# Patient Record
Sex: Male | Born: 2000
Health system: Southern US, Community
[De-identification: ages and names within clinical notes are randomized; demographics above are authoritative.]

## PROBLEM LIST (undated history)

## (undated) DIAGNOSIS — R569 Unspecified convulsions: Secondary | ICD-10-CM

## (undated) HISTORY — PX: NO PAST SURGERIES: SHX2092

## (undated) HISTORY — DX: Unspecified convulsions: R56.9

---
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
## 2021-01-01 DIAGNOSIS — S42412D Displaced simple supracondylar fracture without intercondylar fracture of left humerus, subsequent encounter for fracture with routine healing: Secondary | ICD-10-CM | POA: Diagnosis not present

## 2021-01-05 DIAGNOSIS — R531 Weakness: Secondary | ICD-10-CM | POA: Diagnosis not present

## 2021-01-05 DIAGNOSIS — S42492D Other displaced fracture of lower end of left humerus, subsequent encounter for fracture with routine healing: Secondary | ICD-10-CM | POA: Diagnosis not present

## 2021-01-05 DIAGNOSIS — M25622 Stiffness of left elbow, not elsewhere classified: Secondary | ICD-10-CM | POA: Diagnosis not present

## 2021-01-08 DIAGNOSIS — M25622 Stiffness of left elbow, not elsewhere classified: Secondary | ICD-10-CM | POA: Diagnosis not present

## 2021-01-08 DIAGNOSIS — S42492D Other displaced fracture of lower end of left humerus, subsequent encounter for fracture with routine healing: Secondary | ICD-10-CM | POA: Diagnosis not present

## 2021-01-08 DIAGNOSIS — R531 Weakness: Secondary | ICD-10-CM | POA: Diagnosis not present

## 2021-01-12 DIAGNOSIS — M25622 Stiffness of left elbow, not elsewhere classified: Secondary | ICD-10-CM | POA: Diagnosis not present

## 2021-01-12 DIAGNOSIS — R531 Weakness: Secondary | ICD-10-CM | POA: Diagnosis not present

## 2021-01-12 DIAGNOSIS — S42492D Other displaced fracture of lower end of left humerus, subsequent encounter for fracture with routine healing: Secondary | ICD-10-CM | POA: Diagnosis not present

## 2021-01-22 DIAGNOSIS — M25622 Stiffness of left elbow, not elsewhere classified: Secondary | ICD-10-CM | POA: Diagnosis not present

## 2021-01-22 DIAGNOSIS — S42492D Other displaced fracture of lower end of left humerus, subsequent encounter for fracture with routine healing: Secondary | ICD-10-CM | POA: Diagnosis not present

## 2021-01-22 DIAGNOSIS — R531 Weakness: Secondary | ICD-10-CM | POA: Diagnosis not present

## 2021-01-26 DIAGNOSIS — R531 Weakness: Secondary | ICD-10-CM | POA: Diagnosis not present

## 2021-01-26 DIAGNOSIS — M25662 Stiffness of left knee, not elsewhere classified: Secondary | ICD-10-CM | POA: Diagnosis not present

## 2021-01-26 DIAGNOSIS — S42492D Other displaced fracture of lower end of left humerus, subsequent encounter for fracture with routine healing: Secondary | ICD-10-CM | POA: Diagnosis not present

## 2021-02-05 DIAGNOSIS — S42492D Other displaced fracture of lower end of left humerus, subsequent encounter for fracture with routine healing: Secondary | ICD-10-CM | POA: Diagnosis not present

## 2021-02-05 DIAGNOSIS — R531 Weakness: Secondary | ICD-10-CM | POA: Diagnosis not present

## 2021-02-05 DIAGNOSIS — M25622 Stiffness of left elbow, not elsewhere classified: Secondary | ICD-10-CM | POA: Diagnosis not present

## 2021-02-16 ENCOUNTER — Telehealth: Payer: Self-pay

## 2021-02-16 ENCOUNTER — Telehealth: Payer: Self-pay | Admitting: Neurology

## 2021-02-16 NOTE — Telephone Encounter (Signed)
Pt c/o: seizure Missed medications?  no  Sleep deprived?  no Alcohol intake?  none Back to their usual baseline self?  Yes . If no, advise go to ER Current medications prescribed by Dr. Aquino:epidolex 100/ml and zonegran, note patient is feeling fine, just wanted to report a seziure acitivity

## 2021-02-16 NOTE — Telephone Encounter (Signed)
Noted, thanks!

## 2021-02-16 NOTE — Telephone Encounter (Signed)
Pt is calling to inform aquino that he had a seizure. He is okay, it lasted the most 2 minutes.

## 2021-02-19 DIAGNOSIS — S42492D Other displaced fracture of lower end of left humerus, subsequent encounter for fracture with routine healing: Secondary | ICD-10-CM | POA: Diagnosis not present

## 2021-03-09 DIAGNOSIS — S42492D Other displaced fracture of lower end of left humerus, subsequent encounter for fracture with routine healing: Secondary | ICD-10-CM | POA: Diagnosis not present

## 2021-04-20 DIAGNOSIS — S42492D Other displaced fracture of lower end of left humerus, subsequent encounter for fracture with routine healing: Secondary | ICD-10-CM | POA: Diagnosis not present

## 2021-05-11 ENCOUNTER — Other Ambulatory Visit: Payer: Self-pay

## 2021-05-11 ENCOUNTER — Encounter: Payer: Self-pay | Admitting: Neurology

## 2021-05-11 ENCOUNTER — Telehealth (INDEPENDENT_AMBULATORY_CARE_PROVIDER_SITE_OTHER): Payer: BC Managed Care – PPO | Admitting: Neurology

## 2021-05-11 VITALS — Ht 72.0 in | Wt 210.0 lb

## 2021-05-11 DIAGNOSIS — G40219 Localization-related (focal) (partial) symptomatic epilepsy and epileptic syndromes with complex partial seizures, intractable, without status epilepticus: Secondary | ICD-10-CM

## 2021-05-11 MED ORDER — EPIDIOLEX 100 MG/ML PO SOLN: ORAL | 3 refills | Status: DC

## 2021-05-11 MED ORDER — ZONISAMIDE 100 MG PO CAPS: ORAL_CAPSULE | ORAL | 3 refills | Status: DC

## 2021-05-11 NOTE — Addendum Note (Signed)
Addended by: Dimas Chyle on: 05/11/2021 11:54 AM   Modules accepted: Orders

## 2021-05-11 NOTE — Progress Notes (Signed)
Virtual Visit via Video Note The purpose of this virtual visit is to provide medical care while limiting exposure to the novel coronavirus.    Consent was obtained for video visit:  Yes.   Answered questions that patient had about telehealth interaction:  Yes.   I discussed the limitations, risks, security and privacy concerns of performing an evaluation and management service by telemedicine. I also discussed with the patient that there may be a patient responsible charge related to this service. The patient expressed understanding and agreed to proceed.  Pt location: Home Physician Location: office Name of referring provider:  No ref. provider found I connected with Christian Harvey at patients initiation/request on 05/11/2021 at 11:30 AM EST by video enabled telemedicine application and verified that I am speaking with the correct person using two identifiers. Pt MRN:  [id number] Pt DOB:  12-17-2000 Video Participants:  Christian Harvey   History of Present Illness:  The patient had a virtual video visit on 05/11/2021. He was last seen in the neurology clinic 5 months ago for intractable epilepsy. On his last visit, he reported diarrhea on Epidiolex, as well as elevated transaminases. Epidiolex dose reduced to 35mL in AM, 7.38mL in PM. He however reported a seizure on 09/22/20 after being seizure-free for a year, so we agreed to add on low dose Zonisamide 100mg  qhs. He called our office to report a convulsive seizure lasting 2 minutes on 02/16/21, no clear triggers. He denies any staring/confusional episodes. No side effects to current regimen. The diarrhea has significantly improved, he is a little bit now but not that much. He denies any headaches, dizziness, vision changes, focal numbness/tingling/weakness. Sometimes his legs or arms feel weak when he wakes up and did not get enough sleep. Overall he sleeps pretty good with 8 hours at night. He denies any stomach pain. No falls. He will be  [place]rav[place] a [place].   Last AST/ALT in 10/2020 was 153/223.   History on Initial Assessment 06/08/2020: This is a pleasant 20 year old right-handed man with a history of migraines and seizures presenting to establish adult epilepsy care. He was last seen by pediatric neurologist Dr. 15 in 03/2020 and in the past by neurologist Dr. 04/2020 at [facility], records were reviewed and will be summarized as follows. Christian Harvey started having seizures in 2014, the first seizure occurred in a baseball game, he recalls feeling confused, no convulsive activity. Notes from 2014 indicate he did not understand that he needed to pick up the bat to bat and could not answer family information. EEG reported bilateral spike and wave. He was initially started on Depakote which caused weight gain. He had been seizure-free for a year and his mother discontinued use of the medication. He was stable off medication for 6-8 months then had a grand mal seizure on the baseball field, restarted on Depakote which caused drowsiness, then switched to Keppra. He was stable for 2 years until 2018 when he had 3 seizures on the baseball field in a span of 1-2 weeks. He reported getting a headache, photo and phonophobia before the seizures. His baseball coach described episodes where he shakes his head, tells coach he doesn't feel right, then would be confused when calling his mother. He then had another GTC in sleep while at his grandparents house. Depakote was added to Keppra, which again caused side effects. Trileptal was started in 2019 which caused him to feel "high as a kite." EEG in 07/2017 showed "  frequent, sometimes rhythmic vertex sharp waves as well as right temporo-occipital discharges. Left sided discharges appeared improved prior to EEG, but this recording continues to be consistent with focal epilepsy." He was started on Epidiolex in 2019. He had been seizure-free for 2 years until last seizure on 09/01/19 witnessed by his  grandfather. Dr. Blair Heys notes indicate that Epidolex was increased to 4.56mL in AM, 20mL in PM with no further seizures. His grandfather reports that since the seizure they have been giving him 14mL in AM, 7.6mL in PM. He denies any side effects, he was previously having nausea/vomiting and was taking Zofran, but has not needed this in a long time. He has not needed the Valtoco. He reports frequent soft stools for close to a year, no abdominal pain or weight loss. He denies any olfactory/gustatory hallucinations, deja vu, rising epigastric sensation, focal numbness/tingling/weakness. He has occasionally "quick flinches" which his grandfather also has noticed early in the morning. He has migraines every 2-3 months with good response to over the counter pain medication. No associated nausea/vomiting. He denies any dizziness, diplopia, dysarthria/dysphagia, neck/back pain. He gets from 6 to 9 hours of sleep. 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 He has been driving.   Epilepsy Risk Factors:  Maternal aunt has seizures. Otherwise he had a normal birth and early development.  There is no history of febrile convulsions, CNS infections such as meningitis/encephalitis, significant traumatic brain injury, neurosurgical procedures.  Prior AEDs: Trileptal (sedation), Depakote (weight gain), Keppra (weaned to Epidiolex monotherapy)  Diagnostic Data: EEGs: EEG in 07/2017 showed "frequent, sometimes rhythmic vertex sharp waves as well as right temporo-occipital discharges. Left sided discharges appeared improved prior to EEG, but this recording continues to be consistent with focal epilepsy."  MRI: MRI brain without contrast done at [facility] in 2018 reported as normal.   Current Outpatient Medications on File Prior to Visit  Medication Sig Dispense Refill   EPIDIOLEX 100 MG/ML solution Take 5 mL in AM, 7.5 mL in PM 1125 mL 3   methocarbamol (ROBAXIN) 500 MG tablet  Take 1 tablet (500 mg total) by mouth every 6 (six) hours as needed for muscle spasms. 30 tablet 0   zonisamide (ZONEGRAN) 100 MG capsule Take 1 capsule every night 30 capsule 6   No current facility-administered medications on file prior to visit.     Observations/Objective:   Vitals:   05/11/21 1041  Weight: 210 lb (95.3 kg)  Height: 6' (1.829 m)   GEN:  The patient appears stated age and is in NAD.  Neurological examination: Patient is awake, alert. No aphasia or dysarthria. Intact fluency and comprehension. Cranial nerves: Extraocular movements intact with no nystagmus. No facial asymmetry. Motor: moves all extremities symmetrically, at least anti-gravity x 4.   Assessment and Plan:   This is a pleasant 20 yo RH man with a history of migraines and seizures suggestive of focal to bilateral tonic-clonic epilepsy. EEG in 07/2017 showed "frequent, sometimes rhythmic vertex sharp waves as well as right temporo-occipital discharges. Left sided discharges appeared improved prior to EEG, but this recording continues to be consistent with focal epilepsy." MRI brain without contrast done at [facility] in 2018 reported as normal. He reports one convulsion in the past 7 months (02/2021). He is doing better with diarrhea on the lower dose of Epidiolex 75mL in AM, 7.9mL in PM, Zonisamide 100mg  qhs. Continue to monitor LFTs. He is aware of [place] driving laws to  stop driving after a seizure until 6 months seizure-free. Follow-up in 6 months, call for any changes.    Follow Up Instructions:    -I discussed the assessment and treatment plan with the patient. The patient was provided an opportunity to ask questions and all were answered. The patient agreed with the plan and demonstrated an understanding of the instructions.   The patient was advised to call back or seek an in-person evaluation if the symptoms worsen or if the condition fails to improve as anticipated.    Van Clines, MD

## 2021-05-11 NOTE — Patient Instructions (Signed)
Good to hear you are doing well. Continue current medications. Please have your liver function test done. Follow-up in 6 months, call for any changes.    Seizure Precautions: 1. If medication has been prescribed for you to prevent seizures, take it exactly as directed.  Do not stop taking the medicine without talking to your doctor first, even if you have not had a seizure in a long time.   2. Avoid activities in which a seizure would cause danger to yourself or to others.  Don't operate dangerous machinery, swim alone, or climb in high or dangerous places, such as on ladders, roofs, or girders.  Do not drive unless your doctor says you may.  3. If you have any warning that you may have a seizure, lay down in a safe place where you can't hurt yourself.    4.  No driving for 6 months from last seizure, as per [facility].   Please refer to the following link on the Epile[employer] of [employer]'s website for more information: http://www.epilepsyfoundation.org/answerplace/Social/driving/drivingu.cfm   5.  Maintain good sleep hygiene. Avoid alcohol.  6.  Contact your doctor if you have any problems that may be related to the medicine you are taking.  7.  Call 911 and bring the patient back to the ED if:        A.  The seizure lasts longer than 5 minutes.       B.  The patient doesn't awaken shortly after the seizure  C.  The patient has new problems such as difficulty seeing, speaking or moving  D.  The patient was injured during the seizure  E.  The patient has a temperature over 102 F (39C)  F.  The patient vomited and now is having trouble breathing

## 2021-05-18 ENCOUNTER — Other Ambulatory Visit: Payer: Self-pay | Admitting: Neurology

## 2021-05-18 DIAGNOSIS — G40219 Localization-related (focal) (partial) symptomatic epilepsy and epileptic syndromes with complex partial seizures, intractable, without status epilepticus: Secondary | ICD-10-CM

## 2021-05-18 NOTE — Telephone Encounter (Signed)
[facility] number is [phone] [facility] specialty number [phone]

## 2021-05-18 NOTE — Telephone Encounter (Signed)
[facility] called and LM, aquino needs to send script for Epidiolex straight to [facility]. Insurance rejected it. The message was sent to Nurse voicemail as well.

## 2021-05-18 NOTE — Telephone Encounter (Signed)
Sent to Dr Karel Jarvis to send to [facility]

## 2021-05-19 MED ORDER — EPIDIOLEX 100 MG/ML PO SOLN: ORAL | 3 refills | Status: DC

## 2021-06-07 ENCOUNTER — Telehealth: Payer: Self-pay | Admitting: Neurology

## 2021-06-07 NOTE — Telephone Encounter (Signed)
[phone] pt called no answer left a voice mail to call the office back  Pt c/o: seizure

## 2021-06-07 NOTE — Telephone Encounter (Signed)
Spoke to Christian Harvey informed him Would stay on same medication for now. Ok to play basketball, just make sure his brother knows what to do in event of seizure, turn him to his side, make sure there is nothing around him that can injure him. Pt stated that he will make sure his brother knows what to do.

## 2021-06-07 NOTE — Telephone Encounter (Signed)
Patient called stating he had a seizure last night.  He wanted to let Dr Karel Jarvis know.  He also asked if he could play basketball.

## 2021-06-07 NOTE — Telephone Encounter (Signed)
Would stay on same medication for now. Ok to play basketball, just make sure his brother knows what to do in event of seizure, turn him to his side, make sure there is nothing around him that can injure him. Thanks

## 2021-06-07 NOTE — Telephone Encounter (Signed)
Pt c/o: seizure Missed medications?  No. Sleep deprived?  No. Alcohol intake?  No. Back to their usual baseline self?  Yes.  . If no, advise go to  Any increase in stress? No Any triggers? No, just went to bed early  Current medications prescribed by Dr. Delice Lesch:  Epidiolex  Take 5 mL in AM, 7.5 mL in PM Zonisamide 100 mg 1 capsule every night  Asking if it is ok for him and his brother to play basketball with a few other kids,

## 2021-06-07 NOTE — Telephone Encounter (Signed)
LMOVM  for pt to give the office a call back.  

## 2021-11-09 ENCOUNTER — Other Ambulatory Visit (INDEPENDENT_AMBULATORY_CARE_PROVIDER_SITE_OTHER): Payer: BC Managed Care – PPO

## 2021-11-09 ENCOUNTER — Ambulatory Visit: Payer: BC Managed Care – PPO | Admitting: Neurology

## 2021-11-09 ENCOUNTER — Encounter: Payer: Self-pay | Admitting: Neurology

## 2021-11-09 VITALS — BP 130/77 | HR 74 | Ht 72.0 in | Wt 206.0 lb

## 2021-11-09 DIAGNOSIS — G40219 Localization-related (focal) (partial) symptomatic epilepsy and epileptic syndromes with complex partial seizures, intractable, without status epilepticus: Secondary | ICD-10-CM

## 2021-11-09 DIAGNOSIS — R400 Somnolence: Secondary | ICD-10-CM

## 2021-11-09 MED ORDER — ZONISAMIDE 100 MG PO CAPS: ORAL_CAPSULE | ORAL | 3 refills | Status: DC

## 2021-11-09 MED ORDER — EPIDIOLEX 100 MG/ML PO SOLN: ORAL | 3 refills | Status: DC

## 2021-11-09 NOTE — Progress Notes (Signed)
NEUROLOGY FOLLOW UP OFFICE NOTE  SACARIO REETZ [id number] 05-Dec-2000  HISTORY OF PRESENT ILLNESS: I had the pleasure of seeing Javieon Enciso in follow-up in the neurology clinic on 11/09/2021.  The patient was last seen 6 months ago for intractable epilepsy. He is alone in the office today. Since his last visit, he contacted our office to report a seizure in January 2023. He also reports another seizure last week. His seizures have been nocturnal since around 2018, the last time he had awake seizures was when they started in 2014. They mostly occur in the early morning hours. The most recent seizure was again in his sleep, he woke up to his grandparents asking if he was okay, they heard him vocalize and said he was "moving a little bit." Seizure lasted 3 minutes, no injuries, tongue bite, or incontinence. He does not get very good sleep a lot of times despite being in bed for 8-9 hours. He is drowsy during the day a lot of times. He is on Epidiolex 103mL in AM, 7.16mL in PM and Zonisamide 100mg  qhs. The diarrhea is better on the lower dose of Epidiolex, not as much as before, around 2-3 times a week. He denies any staring/unresponsive episodes, focal numbness/tingling/weakness. He has rare quick body jerks. Mood is good.   Last AST/ALT in 10/2020 was 153/223.   History on Initial Assessment 06/08/2020: This is a pleasant 21 year old right-handed man with a history of migraines and seizures presenting to establish adult epilepsy care. He was last seen by pediatric neurologist Dr. Rogers Blocker in 03/2020 and in the past by neurologist Dr. Sabra Heck at [facility], records were reviewed and will be summarized as follows. Jahziah started having seizures in 2014, the first seizure occurred in a baseball game, he recalls feeling confused, no convulsive activity. Notes from 2014 indicate he did not understand that he needed to pick up the bat to bat and could not answer family information. EEG reported bilateral spike  and wave. He was initially started on Depakote which caused weight gain. He had been seizure-free for a year and his mother discontinued use of the medication. He was stable off medication for 6-8 months then had a grand mal seizure on the baseball field, restarted on Depakote which caused drowsiness, then switched to [place]. He was stable for 2 years until 2018 when he had 3 seizures on the baseball field in a span of 1-2 weeks. He reported getting a headache, photo and phonophobia before the seizures. His baseball coach described episodes where he shakes his head, tells coach he doesn't feel right, then would be confused when calling his mother. He then had another GTC in sleep while at his grandparents house. Depakote was added to Keppra, which again caused side effects. Trileptal was started in 2019 which caused him to feel "high as a kite." EEG in 07/2017 showed "frequent, sometimes rhythmic vertex sharp waves as well as right temporo-occipital discharges. Left sided discharges appeared improved prior to EEG, but this recording continues to be consistent with focal epilepsy." He was started on Epidiolex in 2019. He had been seizure-free for 2 years until last seizure on 09/01/19 witnessed by his grandfather. Dr. Shelby Mattocks notes indicate that Epidolex was increased to 4.60mL in AM, 68mL in PM with no further seizures. His grandfather reports that since the seizure they have been giving him 58mL in AM, 7.55mL in PM. He denies any side effects, he was previously having nausea/vomiting and was taking Zofran, but has  not needed this in a long time. He has not needed the Valtoco. He reports frequent soft stools for close to a year, no abdominal pain or weight loss. He denies any olfactory/gustatory hallucinations, deja vu, rising epigastric sensation, focal numbness/tingling/weakness. He has occasionally "quick flinches" which his grandfather also has noticed early in the morning. He has migraines every 2-3 months with good  response to over the counter pain medication. No associated nausea/vomiting. He denies any dizziness, diplopia, dysarthria/dysphagia, neck/back pain. He gets from 6 to 9 hours of sleep. 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 He has been driving.   Epilepsy Risk Factors:  Maternal aunt has seizures. Otherwise he had a normal birth and early development.  There is no history of febrile convulsions, CNS infections such as meningitis/encephalitis, significant traumatic brain injury, neurosurgical procedures.  Prior AEDs: Trileptal (sedation), Depakote (weight gain), Keppra (weaned to Epidiolex monotherapy)  Diagnostic Data: EEGs: EEG in 07/2017 showed "frequent, sometimes rhythmic vertex sharp waves as well as right temporo-occipital discharges. Left sided discharges appeared improved prior to EEG, but this recording continues to be consistent with focal epilepsy."  MRI: MRI brain without contrast done at [facility] in 2018 reported as normal.  PAST MEDICAL HISTORY: Past Medical History:  Diagnosis Date   Seizures (HCC)    last one  5 months ago    MEDICATIONS: Current Outpatient Medications on File Prior to Visit  Medication Sig Dispense Refill   EPIDIOLEX 100 MG/ML solution Take 5 mL in AM, 7.5 mL in PM 1125 mL 3   methocarbamol (ROBAXIN) 500 MG tablet Take 1 tablet (500 mg total) by mouth every 6 (six) hours as needed for muscle spasms. 30 tablet 0   zonisamide (ZONEGRAN) 100 MG capsule Take 1 capsule every night 90 capsule 3   No current facility-administered medications on file prior to visit.    ALLERGIES: No Known Allergies  FAMILY HISTORY: Family History  Problem Relation Age of Onset   Seizures Maternal Aunt    Migraines Maternal Aunt    Anxiety disorder Maternal Aunt    Migraines Cousin    Bipolar disorder Other    Depression Neg Hx    Schizophrenia Neg Hx    ADD / ADHD Neg Hx    Autism Neg Hx     SOCIAL  HISTORY: Social History   Socioeconomic History   Marital status: Single    Spouse name: Not on file   Number of children: Not on file   Years of education: Not on file   Highest education level: Not on file  Occupational History   Not on file  Tobacco Use   Smoking status: Never   Smokeless tobacco: Never  Vaping Use   Vaping Use: Never used  Substance and Sexual Activity   Alcohol use: Never   Drug use: Never   Sexual activity: Not on file  Other Topics Concern   Not on file  Social History Narrative   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 He lives with his parents and siblings. He plays baseball but has recently taken a break from it.    Right handed   Social Determinants of Health   Financial Resource Strain: Not on file  Food Insecurity: Not on file  Transportation Needs: Not on file  Physical Activity: Not on file  Stress: Not on file  Social Connections: Not on file  Intimate Partner Violence: Not  on file     PHYSICAL EXAM: Vitals:   11/09/21 1516  BP: (!) 162/67  Pulse: 74  SpO2: 98%   General: No acute distress Head:  Normocephalic/atraumatic Skin/Extremities: No rash, no edema Neurological Exam: alert and awake. No aphasia or dysarthria. Fund of knowledge is appropriate. Attention and concentration are normal.   Cranial nerves: Pupils equal, round. Extraocular movements intact with no nystagmus. Visual fields full.  No facial asymmetry.  Motor: Bulk and tone normal, muscle strength 5/5 throughout with no pronator drift.   Finger to nose testing intact.  Gait narrow-based and steady, able to tandem walk adequately.  Romberg negative.   IMPRESSION: This is a pleasant 21 yo RH man with a history of migraines and seizures suggestive of focal to bilateral tonic-clonic epilepsy. EEG in 07/2017 showed "frequent, sometimes rhythmic vertex sharp waves as well as right temporo-occipital discharges. Left sided discharges appeared  improved prior to EEG, but this recording continues to be consistent with focal epilepsy." MRI brain without contrast done at [facility] in 2018 reported as normal. He has had 2 nocturnal seizures in the past 6 months, most recently last week.  He reports daytime drowsiness despite getting 8-9 hours of sleep, we discussed doing a sleep study to assess for sleep apnea. If normal, we will increase Zonisamide to 200mg  qhs. Check CBC, CMP. He is aware of [place] driving laws to stop driving after a seizure until 6 months seizure-free. Follow-up in 6 months, call for any changes.    Thank you for allowing me to participate in his care.  Please do not hesitate to call for any questions or concerns.    , M.D.

## 2021-11-10 ENCOUNTER — Encounter: Payer: Self-pay | Admitting: Neurology

## 2021-11-10 ENCOUNTER — Telehealth: Payer: Self-pay

## 2021-11-10 LAB — COMPREHENSIVE METABOLIC PANEL
ALT: 26 U/L (ref 0–53)
AST: 18 U/L (ref 0–37)
Albumin: 4.8 g/dL (ref 3.5–5.2)
Alkaline Phosphatase: 98 U/L (ref 39–117)
BUN: 19 mg/dL (ref 6–23)
CO2: 28 mEq/L (ref 19–32)
Calcium: 9.6 mg/dL (ref 8.4–10.5)
Chloride: 104 mEq/L (ref 96–112)
Creatinine, Ser: 1.13 mg/dL (ref 0.40–1.50)
GFR: 93.79 mL/min (ref >60.00)
Glucose, Bld: 76 mg/dL (ref 70–99)
Potassium: 4.1 mEq/L (ref 3.5–5.1)
Sodium: 139 mEq/L (ref 135–145)
Total Bilirubin: 0.3 mg/dL (ref 0.2–1.2)
Total Protein: 7.9 g/dL (ref 6.0–8.3)

## 2021-11-10 LAB — CBC
HCT: 44.1 % (ref 39.0–52.0)
Hemoglobin: 14.7 g/dL (ref 13.0–17.0)
MCHC: 33.3 g/dL (ref 30.0–36.0)
MCV: 84.6 fl (ref 78.0–100.0)
Platelets: 258 10*3/uL (ref 150.0–400.0)
RBC: 5.21 Mil/uL (ref 4.22–5.81)
RDW: 13.7 % (ref 11.5–14.6)
WBC: 6.9 10*3/uL (ref 4.5–10.5)

## 2021-11-10 NOTE — Telephone Encounter (Signed)
-----   Message from Van Clines, MD sent at 11/10/2021 12:54 PM EDT ----- Pls let him know bloodwork is normal. Liver function tests now normal. thanks

## 2021-11-10 NOTE — Telephone Encounter (Signed)
Pt called an informed bloodwork is normal. Liver function tests now normal

## 2021-12-08 ENCOUNTER — Telehealth: Payer: Self-pay

## 2021-12-08 ENCOUNTER — Other Ambulatory Visit: Payer: Self-pay

## 2021-12-08 DIAGNOSIS — R400 Somnolence: Secondary | ICD-10-CM

## 2021-12-08 DIAGNOSIS — G40219 Localization-related (focal) (partial) symptomatic epilepsy and epileptic syndromes with complex partial seizures, intractable, without status epilepticus: Secondary | ICD-10-CM

## 2021-12-08 NOTE — Telephone Encounter (Signed)
Would do in-lab sleep study with seizure montage

## 2021-12-08 NOTE — Telephone Encounter (Signed)
[employer] is needing a per to per done by 2:30 tomorrow for his home sleep study phone number is [phone]. Use pt insurance ID when calling in

## 2021-12-28 ENCOUNTER — Telehealth: Payer: Self-pay | Admitting: Neurology

## 2021-12-28 NOTE — Telephone Encounter (Signed)
Yes, please reschedule

## 2021-12-28 NOTE — Telephone Encounter (Signed)
Called patient and [place] to let tem know we will have to reschedule and cancel this appointment and do the peer to peer

## 2021-12-28 NOTE — Telephone Encounter (Signed)
Terri from [facility] called and left a voice mail. He said this patient's insurance requires a Peer to peer callback: [phone].  Subscriber ID for [employer] needed for call  Needs done by 11:50 AM 12/29/21.  Call back with an update on how to move forward once complete.

## 2022-01-14 NOTE — Telephone Encounter (Signed)
Called pt and give answer per Dr Karel Jarvis. He understood.

## 2022-01-14 NOTE — Telephone Encounter (Signed)
Pls let him know sleep study would not be approved, we will discuss again on his next follow-up. thanks

## 2022-05-11 DIAGNOSIS — J019 Acute sinusitis, unspecified: Secondary | ICD-10-CM | POA: Diagnosis not present

## 2022-06-07 ENCOUNTER — Ambulatory Visit: Payer: BC Managed Care – PPO | Admitting: Neurology

## 2022-06-07 ENCOUNTER — Encounter: Payer: Self-pay | Admitting: Neurology

## 2022-06-07 DIAGNOSIS — G40219 Localization-related (focal) (partial) symptomatic epilepsy and epileptic syndromes with complex partial seizures, intractable, without status epilepticus: Secondary | ICD-10-CM

## 2022-06-07 MED ORDER — EPIDIOLEX 100 MG/ML PO SOLN: ORAL | 3 refills | Status: DC

## 2022-06-07 MED ORDER — ZONISAMIDE 100 MG PO CAPS: ORAL_CAPSULE | ORAL | 3 refills | Status: DC

## 2022-06-07 NOTE — Progress Notes (Signed)
NEUROLOGY FOLLOW UP OFFICE NOTE  Christian Harvey [id number] Sep 27, 2000  HISTORY OF PRESENT ILLNESS: I had the pleasure of seeing Christian Harvey in follow-up in the neurology clinic on 06/07/2022.  The patient was last seen 7 months ago for intractable epilepsy. He is alone in the office today. Records and images were personally reviewed where available.  Since his last visit, repeat AST/ALT in 10/2021 was normal (elevated in 21/2022). He denies any nocturnal seizures since 10/2021. He denies any staring/unresponsive episodes, gaps in time, olfactory/gustatory hallucinations, focal numbness/tingling/weakness. Every once in a while he would have a sudden jolt, usually at night. No confusion or falls. He has occasional headaches. No dizziness, vision changes. Stomach is getting better, he is not having a much diarrhea since he started going to the gym. He reports getting 8 hours of sleep and denies any daytime drowsiness. He is driving. He lives with his parents. 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   Lab Results  Component Value Date   WBC 6.9 11/09/2021   HGB 14.7 11/09/2021   HCT 44.1 11/09/2021   MCV 84.6 11/09/2021   PLT 258.0 11/09/2021     Chemistry      Component Value Date/Time   NA 139 11/09/2021 1600   K 4.1 11/09/2021 1600   CL 104 11/09/2021 1600   CO2 28 11/09/2021 1600   BUN 19 11/09/2021 1600   CREATININE 1.13 11/09/2021 1600   CREATININE 0.83 11/29/2017 1251      Component Value Date/Time   CALCIUM 9.6 11/09/2021 1600   ALKPHOS 98 11/09/2021 1600   AST 18 11/09/2021 1600   ALT 26 11/09/2021 1600   BILITOT 0.3 11/09/2021 1600       History on Initial Assessment 06/08/2020: This is a pleasant 22 year old right-handed man with a history of migraines and seizures presenting to establish adult epilepsy care. He was last seen by pediatric neurologist Dr. Rogers Blocker in 03/2020 and in the past by neurologist Dr. Sabra Heck at [facility], records were reviewed and  will be summarized as follows. Richrd started having seizures in 2014, the first seizure occurred in a baseball game, he recalls feeling confused, no convulsive activity. Notes from 2014 indicate he did not understand that he needed to pick up the bat to bat and could not answer family information. EEG reported bilateral spike and wave. He was initially started on Depakote which caused weight gain. He had been seizure-free for a year and his mother discontinued use of the medication. He was stable off medication for 6-8 months then had a grand mal seizure on the baseball field, restarted on Depakote which caused drowsiness, then switched to [place]. He was stable for 2 years until 2018 when he had 3 seizures on the baseball field in a span of 1-2 weeks. He reported getting a headache, photo and phonophobia before the seizures. His baseball coach described episodes where he shakes his head, tells coach he doesn't feel right, then would be confused when calling his mother. He then had another GTC in sleep while at his grandparents house. Depakote was added to Keppra, which again caused side effects. Trileptal was started in 2019 which caused him to feel "high as a kite." EEG in 07/2017 showed "frequent, sometimes rhythmic vertex sharp waves as well as right temporo-occipital discharges. Left sided discharges appeared improved prior to EEG, but this recording continues to be consistent with focal epilepsy." He was started on Epidiolex in 2019. He had been  seizure-free for 2 years until last seizure on 09/01/19 witnessed by his grandfather. Dr. Blair Heys notes indicate that Epidolex was increased to 4.40mL in AM, 63mL in PM with no further seizures. His grandfather reports that since the seizure they have been giving him 107mL in AM, 7.27mL in PM. He denies any side effects, he was previously having nausea/vomiting and was taking Zofran, but has not needed this in a long time. He has not needed the Valtoco. He reports frequent  soft stools for close to a year, no abdominal pain or weight loss. He denies any olfactory/gustatory hallucinations, deja vu, rising epigastric sensation, focal numbness/tingling/weakness. He has occasionally "quick flinches" which his grandfather also has noticed early in the morning. He has migraines every 2-3 months with good response to over the counter pain medication. No associated nausea/vomiting. He denies any dizziness, diplopia, dysarthria/dysphagia, neck/back pain. He gets from 6 to 9 hours of sleep. 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 He has been driving.   Epilepsy Risk Factors:  Maternal aunt has seizures. Otherwise he had a normal birth and early development.  There is no history of febrile convulsions, CNS infections such as meningitis/encephalitis, significant traumatic brain injury, neurosurgical procedures.  Prior AEDs: Trileptal (sedation), Depakote (weight gain), Keppra (weaned to Epidiolex monotherapy)  Diagnostic Data: EEGs: EEG in 07/2017 showed "frequent, sometimes rhythmic vertex sharp waves as well as right temporo-occipital discharges. Left sided discharges appeared improved prior to EEG, but this recording continues to be consistent with focal epilepsy."  MRI: MRI brain without contrast done at [facility] in 2018 reported as normal.  PAST MEDICAL HISTORY: Past Medical History:  Diagnosis Date   Seizures (HCC)    last one  5 months ago    MEDICATIONS: Current Outpatient Medications on File Prior to Visit  Medication Sig Dispense Refill   EPIDIOLEX 100 MG/ML solution Take 5 mL in AM, 7.5 mL in PM 1125 mL 3   zonisamide (ZONEGRAN) 100 MG capsule Take 1 capsule every night 90 capsule 3   No current facility-administered medications on file prior to visit.    ALLERGIES: No Known Allergies  FAMILY HISTORY: Family History  Problem Relation Age of Onset   Seizures Maternal Aunt    Migraines Maternal Aunt     Anxiety disorder Maternal Aunt    Migraines Cousin    Bipolar disorder Other    Depression Neg Hx    Schizophrenia Neg Hx    ADD / ADHD Neg Hx    Autism Neg Hx     SOCIAL HISTORY: Social History   Socioeconomic History   Marital status: Single    Spouse name: Not on file   Number of children: Not on file   Years of education: Not on file   Highest education level: Not on file  Occupational History   Not on file  Tobacco Use   Smoking status: Never   Smokeless tobacco: Never  Vaping Use   Vaping Use: Never used  Substance and Sexual Activity   Alcohol use: Never   Drug use: Never   Sexual activity: Not on file  Other Topics Concern   Not on file  Social History Narrative   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 He lives with his parents and siblings. He plays baseball but has recently taken a break from it.    Right handed   Social Determinants of Health   Financial  Resource Strain: Not on file  Food Insecurity: Not on file  Transportation Needs: Not on file  Physical Activity: Not on file  Stress: Not on file  Social Connections: Not on file  Intimate Partner Violence: Not on file     PHYSICAL EXAM: Vitals:   06/07/22 0836  BP: 126/75  Pulse: 78  Resp: 18  SpO2: 99%   General: No acute distress Head:  Normocephalic/atraumatic Skin/Extremities: No rash, no edema Neurological Exam: alert and awake. No aphasia or dysarthria. Fund of knowledge is appropriate.  Attention and concentration are normal.   Cranial nerves: Pupils equal, round. Extraocular movements intact with no nystagmus. Visual fields full.  No facial asymmetry.  Motor: Bulk and tone normal, muscle strength 5/5 throughout with no pronator drift.   Finger to nose testing intact.  Gait narrow-based and steady, able to tandem walk adequately.  Romberg negative.   IMPRESSION: This is a pleasant 22 yo RH man with a history of migraines and seizures suggestive of  focal to bilateral tonic-clonic epilepsy. EEG in 07/2017 showed "frequent, sometimes rhythmic vertex sharp waves as well as right temporo-occipital discharges. Left sided discharges appeared improved prior to EEG, but this recording continues to be consistent with focal epilepsy." MRI brain without contrast done at [facility] in 2018 reported as normal. He denies any nocturnal seizures since 10/2021. He is doing well on current regimen of Epidiolex 53mL in AM, 7.35mL in PM and Zonisamide 100mg  qhs, refills sent. He is aware of [place] driving laws to stop driving after a seizure until 6 months seizure-free. Follow-up in 6 months, call for any changes.   Thank you for allowing me to participate in his care.  Please do not hesitate to call for any questions or concerns.    Ellouise Newer, M.D.

## 2022-06-07 NOTE — Patient Instructions (Signed)
Good to see you doing well. Continue all your medications. Follow-up in 6 months, call for any changes.   Seizure Precautions: 1. If medication has been prescribed for you to prevent seizures, take it exactly as directed.  Do not stop taking the medicine without talking to your doctor first, even if you have not had a seizure in a long time.   2. Avoid activities in which a seizure would cause danger to yourself or to others.  Don't operate dangerous machinery, swim alone, or climb in high or dangerous places, such as on ladders, roofs, or girders.  Do not drive unless your doctor says you may.  3. If you have any warning that you may have a seizure, lay down in a safe place where you can't hurt yourself.    4.  No driving for 6 months from last seizure, as per [place] state law.   Please refer to the following link on the Epilepsy Foundation of America's website for more information: http://www.epilepsyfoundation.org/answerplace/Social/driving/drivingu.cfm   5.  Maintain good sleep hygiene. Avoid alcohol.  6.  Contact your doctor if you have any problems that may be related to the medicine you are taking.  7.  Call 911 and bring the patient back to the ED if:        A.  The seizure lasts longer than 5 minutes.       B.  The patient doesn't awaken shortly after the seizure  C.  The patient has new problems such as difficulty seeing, speaking or moving  D.  The patient was injured during the seizure  E.  The patient has a temperature over 102 F (39C)  F.  The patient vomited and now is having trouble breathing        

## 2022-07-23 DIAGNOSIS — R1013 Epigastric pain: Secondary | ICD-10-CM | POA: Diagnosis not present

## 2022-07-24 DIAGNOSIS — I498 Other specified cardiac arrhythmias: Secondary | ICD-10-CM | POA: Diagnosis not present

## 2022-08-30 DIAGNOSIS — R1013 Epigastric pain: Secondary | ICD-10-CM | POA: Diagnosis not present

## 2022-08-30 DIAGNOSIS — R7401 Elevation of levels of liver transaminase levels: Secondary | ICD-10-CM | POA: Diagnosis not present

## 2022-08-31 DIAGNOSIS — K3189 Other diseases of stomach and duodenum: Secondary | ICD-10-CM | POA: Diagnosis not present

## 2022-08-31 DIAGNOSIS — K2951 Unspecified chronic gastritis with bleeding: Secondary | ICD-10-CM | POA: Diagnosis not present

## 2022-08-31 DIAGNOSIS — R198 Other specified symptoms and signs involving the digestive system and abdomen: Secondary | ICD-10-CM | POA: Diagnosis not present

## 2022-08-31 DIAGNOSIS — R1013 Epigastric pain: Secondary | ICD-10-CM | POA: Diagnosis not present
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
## 2022-09-28 DIAGNOSIS — N1330 Unspecified hydronephrosis: Secondary | ICD-10-CM | POA: Diagnosis not present

## 2022-09-28 DIAGNOSIS — R1013 Epigastric pain: Secondary | ICD-10-CM | POA: Diagnosis not present

## 2022-09-28 DIAGNOSIS — R7401 Elevation of levels of liver transaminase levels: Secondary | ICD-10-CM | POA: Diagnosis not present

## 2022-12-30 ENCOUNTER — Encounter: Payer: Self-pay | Admitting: Neurology

## 2022-12-30 ENCOUNTER — Ambulatory Visit: Payer: BC Managed Care – PPO | Admitting: Neurology

## 2022-12-30 VITALS — BP 146/78 | HR 68 | Ht 72.0 in | Wt 181.2 lb

## 2022-12-30 DIAGNOSIS — G40219 Localization-related (focal) (partial) symptomatic epilepsy and epileptic syndromes with complex partial seizures, intractable, without status epilepticus: Secondary | ICD-10-CM | POA: Diagnosis not present

## 2022-12-30 MED ORDER — ZONISAMIDE 100 MG PO CAPS: ORAL_CAPSULE | ORAL | 3 refills | Status: DC

## 2022-12-30 MED ORDER — EPIDIOLEX 100 MG/ML PO SOLN
ORAL | 3 refills | Status: DC
Start: 2022-12-30 — End: 2023-07-21
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
## 2022-12-30 NOTE — Progress Notes (Signed)
NEUROLOGY FOLLOW UP OFFICE NOTE  Christian Harvey [id number] 02/05/2001  HISTORY OF PRESENT ILLNESS: I had the pleasure of seeing Christian Harvey in follow-up in the neurology clinic on 12/30/2022.  The patient was last seen 6 months ago for intractable epilepsy. He is alone in the office today. Records and images were personally reviewed where available.  Since his last visit, he continues to do well seizure-free since 10/2021 on Epidiolex 5mL in AM, 7.30mL in PM and Zonisamide 100mg  at bedtime, no side effects. He denies any staring/unresponsive episodes, gaps in time, olfactory/gustatory hallucinations, focal numbness/tingling/weakness, myoclonic jerks. No headaches, dizziness, vision changes, no falls. He gets 8 hours of sleep. Mood is good. He was seeing GI for abdominal pain and elevated AST, GI studies normal, he denies any further pain. He is not having diarrhea. He reports repeat LFTs were good, results unavailable for review. He has lost weight going to the gym 4 times a week, weight today 82.2 kg. He is driving. 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    History on Initial Assessment 06/08/2020: This is a pleasant 22 year old right-handed man with a history of migraines and seizures presenting to establish adult epilepsy care. He was last seen by pediatric neurologist Dr. Artis Flock in 03/2020 and in the past by neurologist Dr. Hyacinth Meeker at [facility], records were reviewed and will be summarized as follows. Earline started having seizures in 2014, the first seizure occurred in a baseball game, he recalls feeling confused, no convulsive activity. Notes from 2014 indicate he did not understand that he needed to pick up the bat to bat and could not answer family information. EEG reported bilateral spike and wave. He was initially started on Depakote which caused weight gain. He had been seizure-free for a year and his mother discontinued use of the medication. He was stable off medication for  6-8 months then had a grand mal seizure on the baseball field, restarted on Depakote which caused drowsiness, then switched to Keppra. He was stable for 2 years until 2018 when he had 3 seizures on the baseball field in a span of 1-2 weeks. He reported getting a headache, photo and phonophobia before the seizures. His baseball coach described episodes where he shakes his head, tells coach he doesn't feel right, then would be confused when calling his mother. He then had another GTC in sleep while at his grandparents house. Depakote was added to Keppra, which again caused side effects. Trileptal was started in 2019 which caused him to feel "high as a kite." EEG in 07/2017 showed "frequent, sometimes rhythmic vertex sharp waves as well as right temporo-occipital discharges. Left sided discharges appeared improved prior to EEG, but this recording continues to be consistent with focal epilepsy." He was started on Epidiolex in 2019. He had been seizure-free for 2 years until last seizure on 09/01/19 witnessed by his grandfather. Dr. Blair Heys notes indicate that Epidolex was increased to 4.11mL in AM, 7mL in PM with no further seizures. His grandfather reports that since the seizure they have been giving him 5mL in AM, 7.76mL in PM. He denies any side effects, he was previously having nausea/vomiting and was taking Zofran, but has not needed this in a long time. He has not needed the Valtoco. He reports frequent soft stools for close to a year, no abdominal pain or weight loss. He denies any olfactory/gustatory hallucinations, deja vu, rising epigastric sensation, focal numbness/tingling/weakness. He has occasionally "quick flinches" which his grandfather  also has noticed early in the morning. He has migraines every 2-3 months with good response to over the counter pain medication. No associated nausea/vomiting. He denies any dizziness, diplopia, dysarthria/dysphagia, neck/back pain. He gets from 6 to 9 hours of sleep. 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 He has been driving.   Epilepsy Risk Factors:  Maternal aunt has seizures. Otherwise he had a normal birth and early development.  There is no history of febrile convulsions, CNS infections such as meningitis/encephalitis, significant traumatic brain injury, neurosurgical procedures.  Prior AEDs: Trileptal (sedation), Depakote (weight gain), Keppra (weaned to Epidiolex monotherapy)  Diagnostic Data: EEGs: EEG in 07/2017 showed "frequent, sometimes rhythmic vertex sharp waves as well as right temporo-occipital discharges. Left sided discharges appeared improved prior to EEG, but this recording continues to be consistent with focal epilepsy."  MRI: MRI brain without contrast done at [facility] in 2018 reported as normal.   PAST MEDICAL HISTORY: Past Medical History:  Diagnosis Date   Seizures (HCC)    last one  5 months ago    MEDICATIONS: Current Outpatient Medications on File Prior to Visit  Medication Sig Dispense Refill   EPIDIOLEX 100 MG/ML solution Take 5 mL in AM, 7.5 mL in PM 1125 mL 3   zonisamide (ZONEGRAN) 100 MG capsule Take 1 capsule every night 90 capsule 3   No current facility-administered medications on file prior to visit.    ALLERGIES: No Known Allergies  FAMILY HISTORY: Family History  Problem Relation Age of Onset   Seizures Maternal Aunt    Migraines Maternal Aunt    Anxiety disorder Maternal Aunt    Migraines Cousin    Bipolar disorder Other    Depression Neg Hx    Schizophrenia Neg Hx    ADD / ADHD Neg Hx    Autism Neg Hx     SOCIAL HISTORY: Social History   Socioeconomic History   Marital status: Single    Spouse name: Not on file   Number of children: Not on file   Years of education: Not on file   Highest education level: Not on file  Occupational History   Not on file  Tobacco Use   Smoking status: Never   Smokeless tobacco: Never  Vaping Use   Vaping  status: Never Used  Substance and Sexual Activity   Alcohol use: Never   Drug use: Never   Sexual activity: Not on file  Other Topics Concern   Not on file  Social History Narrative   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 He lives with his parents and siblings. He plays baseball but has recently taken a break from it.    Right handed   Social Determinants of Health   Financial Resource Strain: Not on file  Food Insecurity: Not on file  Transportation Needs: Not on file  Physical Activity: Not on file  Stress: Not on file  Social Connections: Not on file  Intimate Partner Violence: Not on file     PHYSICAL EXAM: Vitals:   12/30/22 0817  BP: (!) 146/78  Pulse: 68  SpO2: 100%   General: No acute distress Head:  Normocephalic/atraumatic Skin/Extremities: No rash, no edema Neurological Exam: alert and awake. No aphasia or dysarthria. Fund of knowledge is appropriate.  Attention and concentration are normal.   Cranial nerves: Pupils equal, round. Extraocular movements intact with no nystagmus. Visual fields full.  No  facial asymmetry.  Motor: Bulk and tone normal, muscle strength 5/5 throughout with no pronator drift.   Finger to nose testing intact.  Gait narrow-based and steady, able to tandem walk adequately.  Romberg negative.   IMPRESSION: This is a pleasant 22 yo RH man with a history of migraines and seizures suggestive of focal to bilateral tonic-clonic epilepsy. EEG in 07/2017 showed "frequent, sometimes rhythmic vertex sharp waves as well as right temporo-occipital discharges. Left sided discharges appeared improved prior to EEG, but this recording continues to be consistent with focal epilepsy." [facility] brain without contrast [facility] in 2018 reported as normal. He continues to do well seizure-free since 10/2021 on Epidiolex 5mL in AM, 7.82mL in PM (15mg /kg/day) and Zonisamide 100mg  qhs, refills sent. He denies any further diarrhea  and reports recent LFTs with GI have been good. He is aware of [place] driving laws to stop driving after a seizure until 6 months seizure-free. Follow-up in 6 months, call for any changes.   Thank you for allowing me to participate in his care.  Please do not hesitate to call for any questions or concerns.    Patrcia Dolly, M.D.

## 2023-06-12 ENCOUNTER — Other Ambulatory Visit: Payer: Self-pay | Admitting: Neurology

## 2023-06-16 DIAGNOSIS — Z1331 Encounter for screening for depression: Secondary | ICD-10-CM | POA: Diagnosis not present

## 2023-06-16 DIAGNOSIS — Z23 Encounter for immunization: Secondary | ICD-10-CM | POA: Diagnosis not present

## 2023-06-16 DIAGNOSIS — G40909 Epilepsy, unspecified, not intractable, without status epilepticus: Secondary | ICD-10-CM | POA: Diagnosis not present

## 2023-06-16 DIAGNOSIS — Z6825 Body mass index (BMI) 25.0-25.9, adult: Secondary | ICD-10-CM | POA: Diagnosis not present

## 2023-07-21 ENCOUNTER — Encounter: Payer: Self-pay | Admitting: Neurology

## 2023-07-21 ENCOUNTER — Ambulatory Visit: Payer: BC Managed Care – PPO | Admitting: Neurology

## 2023-07-21 VITALS — BP 112/64 | HR 60 | Ht 71.0 in | Wt 183.6 lb

## 2023-07-21 DIAGNOSIS — G40219 Localization-related (focal) (partial) symptomatic epilepsy and epileptic syndromes with complex partial seizures, intractable, without status epilepticus: Secondary | ICD-10-CM | POA: Diagnosis not present

## 2023-07-21 MED ORDER — EPIDIOLEX 100 MG/ML PO SOLN
ORAL | 4 refills | Status: DC
Start: 2023-07-21 — End: 2023-07-21

## 2023-07-21 MED ORDER — ZONISAMIDE 100 MG PO CAPS: ORAL_CAPSULE | ORAL | 4 refills | Status: DC

## 2023-07-21 MED ORDER — EPIDIOLEX 100 MG/ML PO SOLN
ORAL | 4 refills | Status: AC
Start: 2023-07-21 — End: ?

## 2023-07-21 NOTE — Patient Instructions (Signed)
Good to see you doing well. Continue all your medications. Follow-up in 1 year, call for any changes. ° ° °Seizure Precautions: °1. If medication has been prescribed for you to prevent seizures, take it exactly as directed.  Do not stop taking the medicine without talking to your doctor first, even if you have not had a seizure in a long time.  ° °2. Avoid activities in which a seizure would cause danger to yourself or to others.  Don't operate dangerous machinery, swim alone, or climb in high or dangerous places, such as on ladders, roofs, or girders.  Do not drive unless your doctor says you may. ° °3. If you have any warning that you may have a seizure, lay down in a safe place where you can't hurt yourself.   ° °4.  No driving for 6 months from last seizure, as per [place] state law.   Please refer to the following link on the Epilepsy Foundation of America's website for more information: http://www.epilepsyfoundation.org/answerplace/Social/driving/drivingu.cfm  ° °5.  Maintain good sleep hygiene. Avoid alcohol. ° °6.  Contact your doctor if you have any problems that may be related to the medicine you are taking. ° °7.  Call 911 and bring the patient back to the ED if: °      ° A.  The seizure lasts longer than 5 minutes.      ° B.  The patient doesn't awaken shortly after the seizure ° C.  The patient has new problems such as difficulty seeing, speaking or moving ° D.  The patient was injured during the seizure ° E.  The patient has a temperature over 102 F (39C) ° F.  The patient vomited and now is having trouble breathing °      ° °

## 2023-07-21 NOTE — Progress Notes (Signed)
 NEUROLOGY FOLLOW UP OFFICE NOTE  COTEY RAKES [id number] 2000-08-29  HISTORY OF PRESENT ILLNESS: I had the pleasure of seeing Kota Ciancio in follow-up in the neurology clinic on 07/21/2023.  The patient was last seen 7 months ago for intractable epilepsy. He is alone in the office today. Records and images were personally reviewed where available.  Since his last visit, he reports doing well seizure-free since 10/2021 on Epidiolex 5mL in AM, 7.55mL in PM and Zonisamide 100mg  at bedtime, no side effects. No further diarrhea. He denies any staring/unresponsive episodes, gaps in time, olfactory/gustatory hallucinations, focal numbness/tingling/weakness, myoclonic jerks. No significant headaches, dizziness. He had an eye exam with note of changes on the right eye, he may need glasses. No falls. He gets at least 9 hours of sleep. Mood is good. 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 He is driving.    History on Initial Assessment 06/08/2020: This is a pleasant 23 year old right-handed man with a history of migraines and seizures presenting to establish adult epilepsy care. He was last seen by pediatric neurologist Dr. Artis Flock in 03/2020 and in the past by neurologist Dr. Hyacinth Meeker at [facility], records were reviewed and will be summarized as follows. [place] started having seizures in 2014, the first seizure occurred in a baseball game, he recalls feeling confused, no convulsive activity. Notes from 2014 indicate he did not understand that he needed to pick up the bat to bat and could not answer family information. EEG reported bilateral spike and wave. He was initially started on Depakote which caused weight gain. He had been seizure-free for a year and his mother discontinued use of the medication. He was stable off medication for 6-8 months then had a grand mal seizure on the baseball field, restarted on Depakote which caused drowsiness, then switched to Keppra. He was  stable for 2 years until 2018 when he had 3 seizures on the baseball field in a span of 1-2 weeks. He reported getting a headache, photo and phonophobia before the seizures. His baseball coach described episodes where he shakes his head, tells coach he doesn't feel right, then would be confused when calling his mother. He then had another GTC in sleep while at his grandparents house. Depakote was added to Keppra, which again caused side effects. Trileptal was started in 2019 which caused him to feel "high as a kite." EEG in 07/2017 showed "frequent, sometimes rhythmic vertex sharp waves as well as right temporo-occipital discharges. Left sided discharges appeared improved prior to EEG, but this recording continues to be consistent with focal epilepsy." He was started on Epidiolex in 2019. He had been seizure-free for 2 years until last seizure on 09/01/19 witnessed by his grandfather. Dr. Blair Heys notes indicate that Epidolex was increased to 4.19mL in AM, 7mL in PM with no further seizures. His grandfather reports that since the seizure they have been giving him 5mL in AM, 7.25mL in PM. He denies any side effects, he was previously having nausea/vomiting and was taking Zofran, but has not needed this in a long time. He has not needed the Valtoco. He reports frequent soft stools for close to a year, no abdominal pain or weight loss. He denies any olfactory/gustatory hallucinations, deja vu, rising epigastric sensation, focal numbness/tingling/weakness. He has occasionally "quick flinches" which his grandfather also has noticed early in the morning. He has migraines every 2-3 months with good response to over the counter pain medication. No associated nausea/vomiting. He  denies any dizziness, diplopia, dysarthria/dysphagia, neck/back pain. He gets from 6 to 9 hours of sleep. 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 He has been driving.   Epilepsy Risk Factors:  Maternal  aunt has seizures. Otherwise he had a normal birth and early development.  There is no history of febrile convulsions, CNS infections such as meningitis/encephalitis, significant traumatic brain injury, neurosurgical procedures.  Prior AEDs: Trileptal (sedation), Depakote (weight gain), Keppra (weaned to Epidiolex monotherapy)  Diagnostic Data: EEGs: EEG in 07/2017 showed "frequent, sometimes rhythmic vertex sharp waves as well as right temporo-occipital discharges. Left sided discharges appeared improved prior to EEG, but this recording continues to be consistent with focal epilepsy."  MRI: MRI brain without contrast done at [facility] in 2018 reported as normal.   PAST MEDICAL HISTORY: Past Medical History:  Diagnosis Date   Seizures (HCC)    last one  5 months ago    MEDICATIONS: Current Outpatient Medications on File Prior to Visit  Medication Sig Dispense Refill   EPIDIOLEX 100 MG/ML solution Take 5 mL in AM, 7.5 mL in PM 1125 mL 3   zonisamide (ZONEGRAN) 100 MG capsule TAKE 1 CAPSULE BY MOUTH EVERY NIGHT 90 capsule 0   No current facility-administered medications on file prior to visit.    ALLERGIES: No Known Allergies  FAMILY HISTORY: Family History  Problem Relation Age of Onset   Seizures Maternal Aunt    Migraines Maternal Aunt    Anxiety disorder Maternal Aunt    Migraines Cousin    Bipolar disorder Other    Depression Neg Hx    Schizophrenia Neg Hx    ADD / ADHD Neg Hx    Autism Neg Hx     SOCIAL HISTORY: Social History   Socioeconomic History   Marital status: Single    Spouse name: Not on file   Number of children: Not on file   Years of education: Not on file   Highest education level: Not on file  Occupational History   Not on file  Tobacco Use   Smoking status: Never   Smokeless tobacco: Never  Vaping Use   Vaping status: Never Used  Substance and Sexual Activity   Alcohol use: Never   Drug use: Never   Sexual activity: Not on file  Other  Topics Concern   Not on file  Social History Narrative   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 He lives with his parents and siblings. He plays baseball but has recently taken a break from it.    Right handed   Social Drivers of Corporate investment banker Strain: Not on file  Food Insecurity: Not on file  Transportation Needs: Not on file  Physical Activity: Not on file  Stress: Not on file  Social Connections: Not on file  Intimate Partner Violence: Not on file     PHYSICAL EXAM: Vitals:   07/21/23 0818  BP: 112/64  Pulse: 60  SpO2: 99%   General: No acute distress Head:  Normocephalic/atraumatic Skin/Extremities: No rash, no edema Neurological Exam: alert and awake. No aphasia or dysarthria. Fund of knowledge is appropriate.  Attention and concentration are normal.   Cranial nerves: Pupils equal, round. Extraocular movements intact with no nystagmus. Visual fields full.  No facial asymmetry.  Motor: Bulk and tone normal, muscle strength 5/5 throughout with no pronator drift.   Finger to nose testing intact.  Gait  narrow-based and steady, able to tandem walk adequately.  Romberg negative.   IMPRESSION: This is a pleasant 23 yo RH man with a history of migraines and seizures suggestive of focal to bilateral tonic-clonic epilepsy. EEG in 07/2017 showed "frequent, sometimes rhythmic vertex sharp waves as well as right temporo-occipital discharges. Left sided discharges appeared improved prior to EEG, but this recording continues to be consistent with focal epilepsy." MRI brain without contrast done at [facility] in 2018 reported as normal. He continues to do well seizure-free since 10/2021 on Epidiolex 5mL in AM, 7.42mL in PM (15mg /kg/day) and Zonisamide 100mg  qhs, refills sent. We discussed avoidance of seizure triggers, including missing medications, sleep deprivation, and alcohol. He is aware of [place] driving laws to stop driving after a seizure  until 6 months seizure-free. Follow-up in 1 year, call for any changes.    Thank you for allowing me to participate in his care.  Please do not hesitate to call for any questions or concerns.    Patrcia Dolly, M.D.

## 2024-01-12 ENCOUNTER — Other Ambulatory Visit: Payer: Self-pay | Admitting: Neurology

## 2024-03-28 ENCOUNTER — Encounter: Payer: Self-pay | Admitting: Neurology

## 2024-07-19 ENCOUNTER — Ambulatory Visit: Admitting: Neurology
